# Patient Record
Sex: Male | Born: 1960 | Race: Black or African American | Hispanic: No | Marital: Married | Smoking: Never smoker
Health system: Southern US, Community
[De-identification: ages and names within clinical notes are randomized; demographics above are authoritative.]

## PROBLEM LIST (undated history)

## (undated) DIAGNOSIS — E7800 Pure hypercholesterolemia, unspecified: Secondary | ICD-10-CM

## (undated) DIAGNOSIS — E119 Type 2 diabetes mellitus without complications: Secondary | ICD-10-CM

## (undated) DIAGNOSIS — I10 Essential (primary) hypertension: Secondary | ICD-10-CM

---

## 1999-01-16 ENCOUNTER — Encounter: Payer: Self-pay | Admitting: Emergency Medicine

## 1999-01-16 ENCOUNTER — Emergency Department (HOSPITAL_COMMUNITY): Admission: EM | Admit: 1999-01-16 | Discharge: 1999-01-16 | Payer: Self-pay | Admitting: Emergency Medicine

## 2008-03-18 ENCOUNTER — Encounter: Admission: RE | Admit: 2008-03-18 | Discharge: 2008-03-18 | Payer: Self-pay | Admitting: Family Medicine

## 2008-08-27 ENCOUNTER — Emergency Department (HOSPITAL_BASED_OUTPATIENT_CLINIC_OR_DEPARTMENT_OTHER): Admission: EM | Admit: 2008-08-27 | Discharge: 2008-08-27 | Payer: Self-pay | Admitting: Emergency Medicine

## 2008-08-27 ENCOUNTER — Ambulatory Visit: Payer: Self-pay | Admitting: Radiology

## 2009-01-18 ENCOUNTER — Emergency Department (HOSPITAL_BASED_OUTPATIENT_CLINIC_OR_DEPARTMENT_OTHER): Admission: EM | Admit: 2009-01-18 | Discharge: 2009-01-18 | Payer: Self-pay | Admitting: Emergency Medicine

## 2010-03-14 ENCOUNTER — Encounter: Payer: Self-pay | Admitting: Family Medicine

## 2013-05-11 ENCOUNTER — Emergency Department (HOSPITAL_BASED_OUTPATIENT_CLINIC_OR_DEPARTMENT_OTHER)
Admission: EM | Admit: 2013-05-11 | Discharge: 2013-05-11 | Disposition: A | Discharge status: Home / Self Care | Payer: BC Managed Care – PPO | Attending: Emergency Medicine | Admitting: Emergency Medicine

## 2013-05-11 ENCOUNTER — Encounter (HOSPITAL_BASED_OUTPATIENT_CLINIC_OR_DEPARTMENT_OTHER): Payer: Self-pay | Admitting: Emergency Medicine

## 2013-05-11 ENCOUNTER — Emergency Department (HOSPITAL_BASED_OUTPATIENT_CLINIC_OR_DEPARTMENT_OTHER): Payer: BC Managed Care – PPO

## 2013-05-11 DIAGNOSIS — E7800 Pure hypercholesterolemia, unspecified: Secondary | ICD-10-CM | POA: Insufficient documentation

## 2013-05-11 DIAGNOSIS — J40 Bronchitis, not specified as acute or chronic: Secondary | ICD-10-CM

## 2013-05-11 DIAGNOSIS — Z79899 Other long term (current) drug therapy: Secondary | ICD-10-CM | POA: Insufficient documentation

## 2013-05-11 DIAGNOSIS — IMO0001 Reserved for inherently not codable concepts without codable children: Secondary | ICD-10-CM | POA: Insufficient documentation

## 2013-05-11 DIAGNOSIS — J029 Acute pharyngitis, unspecified: Secondary | ICD-10-CM | POA: Insufficient documentation

## 2013-05-11 DIAGNOSIS — I10 Essential (primary) hypertension: Secondary | ICD-10-CM | POA: Insufficient documentation

## 2013-05-11 DIAGNOSIS — E669 Obesity, unspecified: Secondary | ICD-10-CM | POA: Insufficient documentation

## 2013-05-11 DIAGNOSIS — J209 Acute bronchitis, unspecified: Secondary | ICD-10-CM | POA: Insufficient documentation

## 2013-05-11 HISTORY — DX: Essential (primary) hypertension: I10

## 2013-05-11 HISTORY — DX: Pure hypercholesterolemia, unspecified: E78.00

## 2013-05-11 MED ORDER — AZITHROMYCIN 250 MG PO TABS: 250.0000 mg | ORAL_TABLET | Freq: Every day | ORAL | Status: DC

## 2013-05-11 NOTE — ED Notes (Signed)
Patient states that he has had cold symptoms for appx 3 weeks, now he states that he still has chest congestion that worsens at night when he lays down. He states that he does have a cough that is productive, pt describes as dark yellow phlegm.

## 2013-05-11 NOTE — ED Provider Notes (Signed)
History/physical exam/procedure(s) were performed by non-physician practitioner and as supervising physician I was immediately available for consultation/collaboration. I have reviewed all notes and am in agreement with care and plan.   Hilario Quarryanielle S Nadelyn Enriques, MD 05/11/13 [phone]

## 2013-05-11 NOTE — ED Provider Notes (Signed)
CSN: [id number]     Arrival date & time 05/11/13  1842 History   First MD Initiated Contact with Patient 05/11/13 1924     Chief Complaint  Patient presents with  . Cough     (Consider location/radiation/quality/duration/timing/severity/associated sxs/prior Treatment) Patient is a 53 y.o. male presenting with cough. The history is provided by the patient.  Cough Cough characteristics:  Productive Sputum characteristics:  Yellow Severity:  Moderate Onset quality:  Gradual Duration:  3 weeks Timing:  Intermittent Progression:  Worsening Chronicity:  New Smoker: no   Relieved by:  Nothing Worsened by:  Activity and lying down Ineffective treatments:  Decongestant and cough suppressants Associated symptoms: headaches, myalgias, rhinorrhea, sinus congestion, sore throat and wheezing   Associated symptoms: no chills, no ear fullness, no ear pain, no eye discharge, no fever and no rash    KOREE SCHOPF is 53 y.o. male who presents to the ED with cough and congestion for 3 weeks. He saw his doctor a week or so ago and was told to just take OTC medication for cough and congestion. He has been doing that but not getting any better.   Past Medical History  Diagnosis Date  . Hypertension   . Hypercholesteremia    History reviewed. No pertinent past surgical history. No family history on file. History  Substance Use Topics  . Smoking status: Never Smoker   . Smokeless tobacco: Not on file  . Alcohol Use: No    Review of Systems  Constitutional: Negative for fever and chills.  HENT: Positive for rhinorrhea, sinus pressure and sore throat. Negative for ear pain.   Eyes: Negative for discharge.  Respiratory: Positive for cough and wheezing.   Gastrointestinal: Negative for nausea, vomiting and abdominal pain.  Genitourinary: Negative for dysuria, urgency and frequency.  Musculoskeletal: Positive for myalgias.  Skin: Negative for rash.  Neurological: Positive for headaches.  Negative for syncope.  Psychiatric/Behavioral: Negative for confusion. The patient is not nervous/anxious.       Allergies  Review of patient's allergies indicates no known allergies.  Home Medications   Current Outpatient Rx  Name  Route  Sig  Dispense  Refill  . amLODipine (NORVASC) 10 MG tablet   Oral   Take 10 mg by mouth daily.         . hydrochlorothiazide (HYDRODIURIL) 25 MG tablet   Oral   Take 25 mg by mouth daily.         . simvastatin (ZOCOR) 10 MG tablet   Oral   Take 10 mg by mouth daily.          BP 127/77  Pulse 92  Temp(Src) 98.2 F (36.8 C) (Oral)  Resp 18  Ht 6\' 3"  (1.905 m)  Wt 300 lb (136.079 kg)  BMI 37.50 kg/m2  SpO2 97% Physical Exam  Nursing note and vitals reviewed. Constitutional: He is oriented to person, place, and time.  Obese   HENT:  Head: Normocephalic.  Right Ear: Tympanic membrane normal.  Left Ear: Tympanic membrane normal.  Nose: Right sinus exhibits maxillary sinus tenderness. Left sinus exhibits maxillary sinus tenderness.  Mouth/Throat: Uvula is midline, oropharynx is clear and moist and mucous membranes are normal.  Eyes: Conjunctivae and EOM are normal. Pupils are equal, round, and reactive to light.  Neck: Normal range of motion. Neck supple.  Cardiovascular: Normal rate and regular rhythm.   Pulmonary/Chest: Effort normal. He has no wheezes. He has no rales.  Abdominal: Soft. Bowel sounds are normal. There  is no tenderness.  Musculoskeletal: Normal range of motion.  Neurological: He is alert and oriented to person, place, and time. No cranial nerve deficit.  Skin: Skin is warm and dry.  Psychiatric: He has a normal mood and affect. His behavior is normal.    ED Course  Procedures (including critical care time) Labs Review Labs Reviewed - No data to display Imaging Review Dg Chest 2 View  05/11/2013   CLINICAL DATA:  Cold symptoms  EXAM: CHEST  2 VIEW  COMPARISON:  No comparisons  FINDINGS: Normal  mediastinum and cardiac silhouette. Normal pulmonary vasculature. No evidence of effusion, infiltrate, or pneumothorax. No acute bony abnormality.  IMPRESSION: No acute cardiopulmonary process.   Electronically Signed   By: Genevive BiStewart  Edmunds M.D.   On: 05/11/2013 19:42    MDM  53 y.o. male with cough and congestion x 3 weeks. Will treat with Z-Pak and he will continue his cough and congestion medication. He is stable for discharge without further screening at this time. I have reviewed this patient's vital signs, nurses notes, appropriate labs and imaging.  I have discussed findings and plan of care with the patient and he voices understanding.    Medication List    TAKE these medications       azithromycin 250 MG tablet  Commonly known as:  ZITHROMAX  Take 1 tablet (250 mg total) by mouth daily. Take first 2 tablets together, then 1 every day until finished.      ASK your doctor about these medications       amLODipine 10 MG tablet  Commonly known as:  NORVASC  Take 10 mg by mouth daily.     hydrochlorothiazide 25 MG tablet  Commonly known as:  HYDRODIURIL  Take 25 mg by mouth daily.     simvastatin 10 MG tablet  Commonly known as:  ZOCOR  Take 10 mg by mouth daily.          ChelseaHope M Neese, [place]NP 05/11/13 2041

## 2013-05-11 NOTE — Discharge Instructions (Signed)
Continue your cough and congestion medication and take the antibiotics as directed. Follow up with your doctor or return here as needed.  Bronchitis Bronchitis is swelling (inflammation) of the air tubes leading to your lungs (bronchi). This causes mucus and a cough. If the swelling gets bad, you may have trouble breathing. HOME CARE   Rest.  Drink enough fluids to keep your pee (urine) clear or pale yellow (unless you have a condition where you have to watch how much you drink).  Only take medicine as told by your doctor. If you were given antibiotic medicines, finish them even if you start to feel better.  Avoid smoke, irritating chemicals, and strong smells. These make the problem worse. Quit smoking if you smoke. This helps your lungs heal faster.  Use a cool mist humidifier. Change the water in the humidifier every day. You can also sit in the bathroom with hot shower running for 5 10 minutes. Keep the door closed.  See your health care provider as told.  Wash your hands often. GET HELP IF: Your problems do not get better after 1 week. GET HELP RIGHT AWAY IF:   Your fever gets worse.  You have chills.  Your chest hurts.  Your problems breathing get worse.  You have blood in your mucus.  You pass out (faint).  You feel lightheaded.  You have a bad headache.  You throw up (vomit) again and again. MAKE SURE YOU:  Understand these instructions.  Will watch your condition.  Will get help right away if you are not doing well or get worse. Document Released: 07/27/2007 Document Revised: 11/28/2012 Document Reviewed: 10/02/2012 [facility] LP[facility] Patient Information 2014 [place], [place].

## 2015-06-15 ENCOUNTER — Encounter: Payer: Self-pay | Admitting: Podiatry

## 2015-06-15 ENCOUNTER — Ambulatory Visit (INDEPENDENT_AMBULATORY_CARE_PROVIDER_SITE_OTHER): Payer: BC Managed Care – PPO | Admitting: Podiatry

## 2015-06-15 VITALS — BP 134/84 | HR 84 | Ht 73.0 in | Wt 270.0 lb

## 2015-06-15 DIAGNOSIS — M79673 Pain in unspecified foot: Secondary | ICD-10-CM

## 2015-06-15 DIAGNOSIS — M216X9 Other acquired deformities of unspecified foot: Secondary | ICD-10-CM | POA: Diagnosis not present

## 2015-06-15 DIAGNOSIS — M216X1 Other acquired deformities of right foot: Secondary | ICD-10-CM

## 2015-06-15 DIAGNOSIS — M21969 Unspecified acquired deformity of unspecified lower leg: Secondary | ICD-10-CM

## 2015-06-15 DIAGNOSIS — M722 Plantar fascial fibromatosis: Secondary | ICD-10-CM | POA: Diagnosis not present

## 2015-06-15 DIAGNOSIS — M79606 Pain in leg, unspecified: Secondary | ICD-10-CM

## 2015-06-15 NOTE — Progress Notes (Signed)
SUBJECTIVE: 55 y.o. year old male presents for diabetic foot care. Both feet are hurting and burning at instep area for a month. Blood sugar is under control. 160-120. Diabetic x 1 and a half years.  On feet x 17 hours/ day working in two jobs.   Hx of bone spur removal for left anterior lateral ankle. DM, HTN, Cholesterol.  REVIEW OF SYSTEMS: A comprehensive review of systems was negative except for: Diabetes, Hypertension, and high Cholesterol.   OBJECTIVE: DERMATOLOGIC EXAMINATION: Thick hypertrophic nails x 10. No abnormal skin lesions noted.  VASCULAR EXAMINATION OF LOWER LIMBS: Pedal pulses: All pedal pulses are palpable with normal pulsation. Temperature gradient from tibial crest to dorsum of foot is within normal bilateral.  NEUROLOGIC EXAMINATION OF THE LOWER LIMBS: Achilles DTR is present and within normal. Monofilament (Semmes-Weinstein 10-gm) sensory testing positive 6 out of 6, bilateral. Vibratory sensations(128Hz  turning fork) intact at medial and lateral forefoot bilateral.  Sharp and Dull discriminatory sensations at the plantar ball of hallux is intact bilateral.   MUSCULOSKELETAL EXAMINATION: Positive for short first digit bilateral, excess sagittal plane motion of the first metatarsal L>R, tight Achilles tendon on right.  Forefoot varus when rearfoot is position in neutral bilateral.   RADIOGRAPHIC STUDIES:  General overview: Negative of Osteopenia Absence of soft tissue swelling. No acute changes in osseous or articular surfaces of forefoot or rearfoot.  AP View:  Short first metatarsal L>R and no other gross deformities. Lateral view:  Mild first ray elevation bilateral. Mild anterior break in CYMA line.   ASSESSMENT: 1. Plantar fasciitis bilateral. 2. 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 3. Ankle equinus right. 4. NIDDM.   PLAN: Reviewed clinical findings and available treatment options, NSAIA, exercise, custom orthotics, surgical  options. Reviewed daily stretch exercise for tight Achilles tendon. Return for custom orthotics.

## 2015-06-15 NOTE — Patient Instructions (Signed)
Seen for painful, burning arch both feet. Examination noted of weakened first metatarsal bone on both L>R, and tight Achilles tendon on right. Stretch exercise reviewed. Need Custom orthotics.

## 2015-07-02 ENCOUNTER — Ambulatory Visit: Payer: BC Managed Care – PPO | Admitting: Podiatry

## 2015-07-06 ENCOUNTER — Ambulatory Visit: Payer: BC Managed Care – PPO | Admitting: Podiatry

## 2016-03-08 IMAGING — CR DG CHEST 2V
2 series · 2 of 2 positions shown · non-contrast
Comparison: No comparisons

CLINICAL DATA: Cold symptoms

EXAM:
CHEST  2 VIEW

[w chest pa]
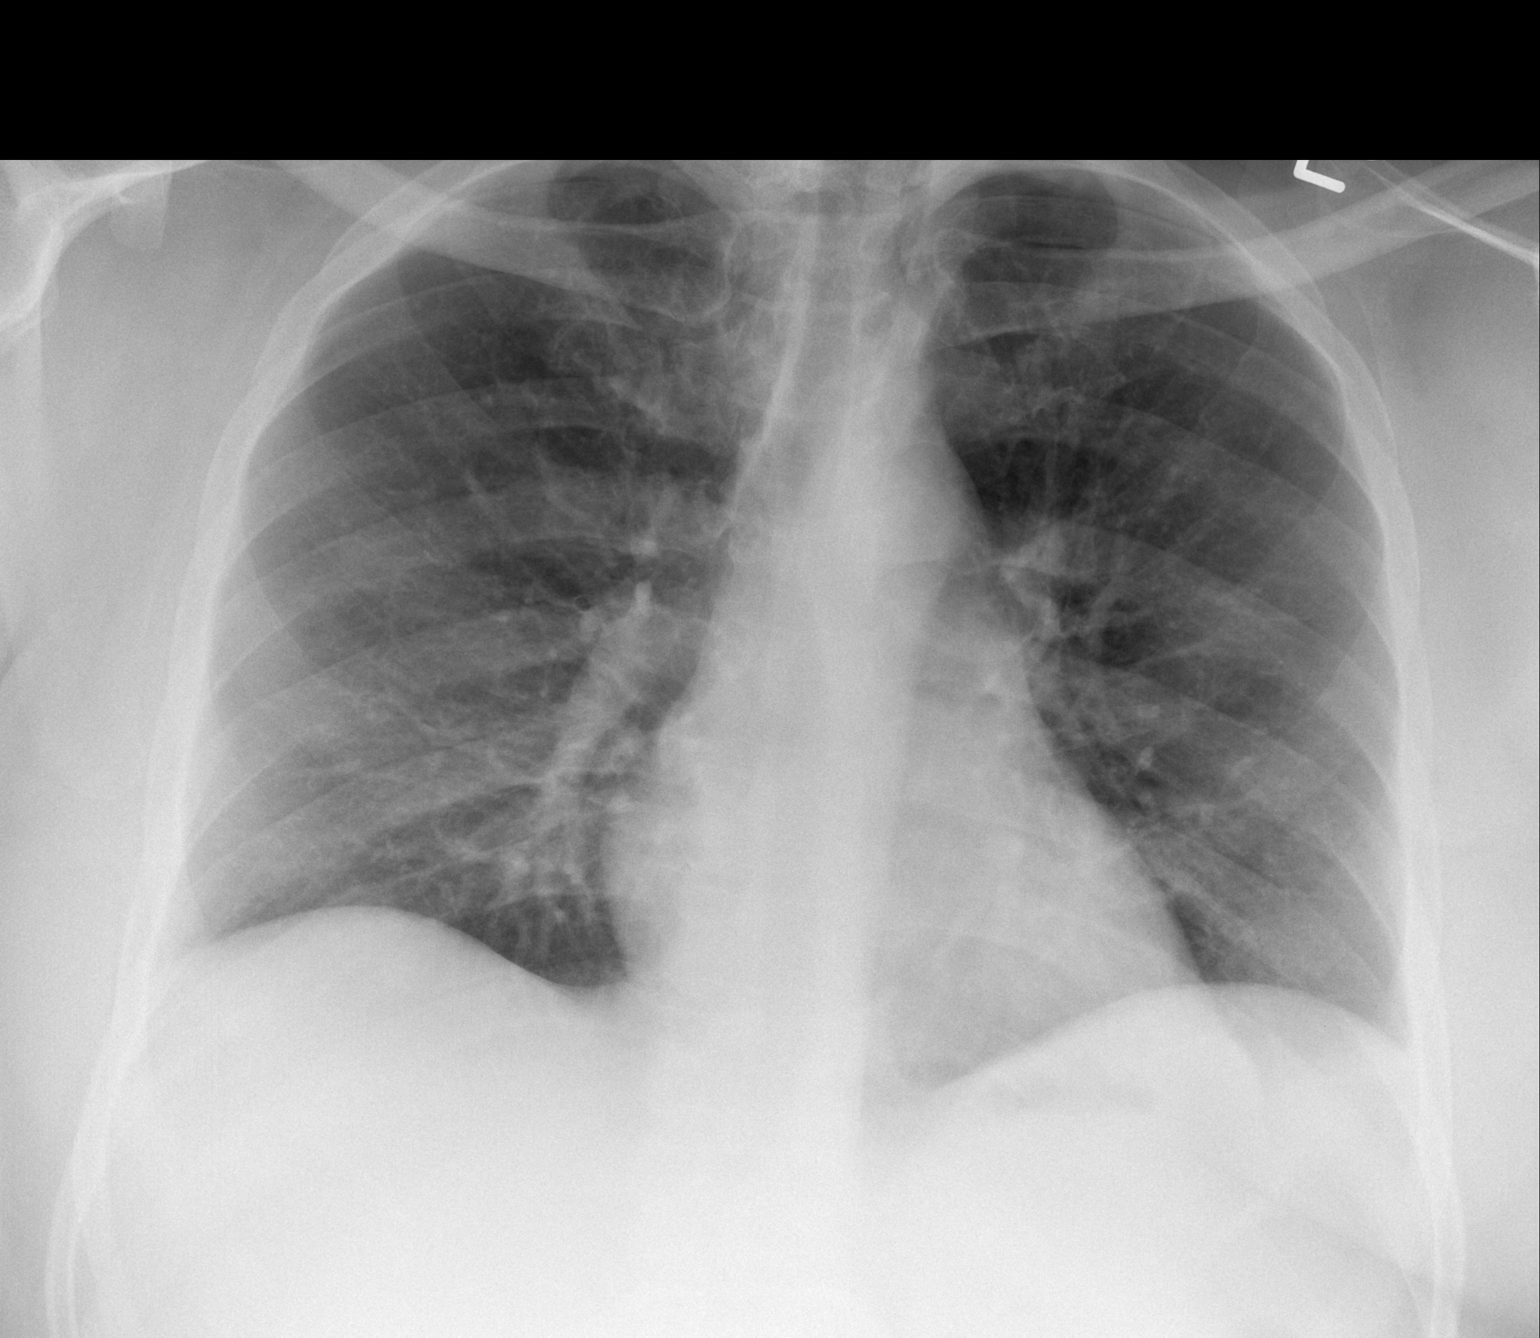

[w chest lat]
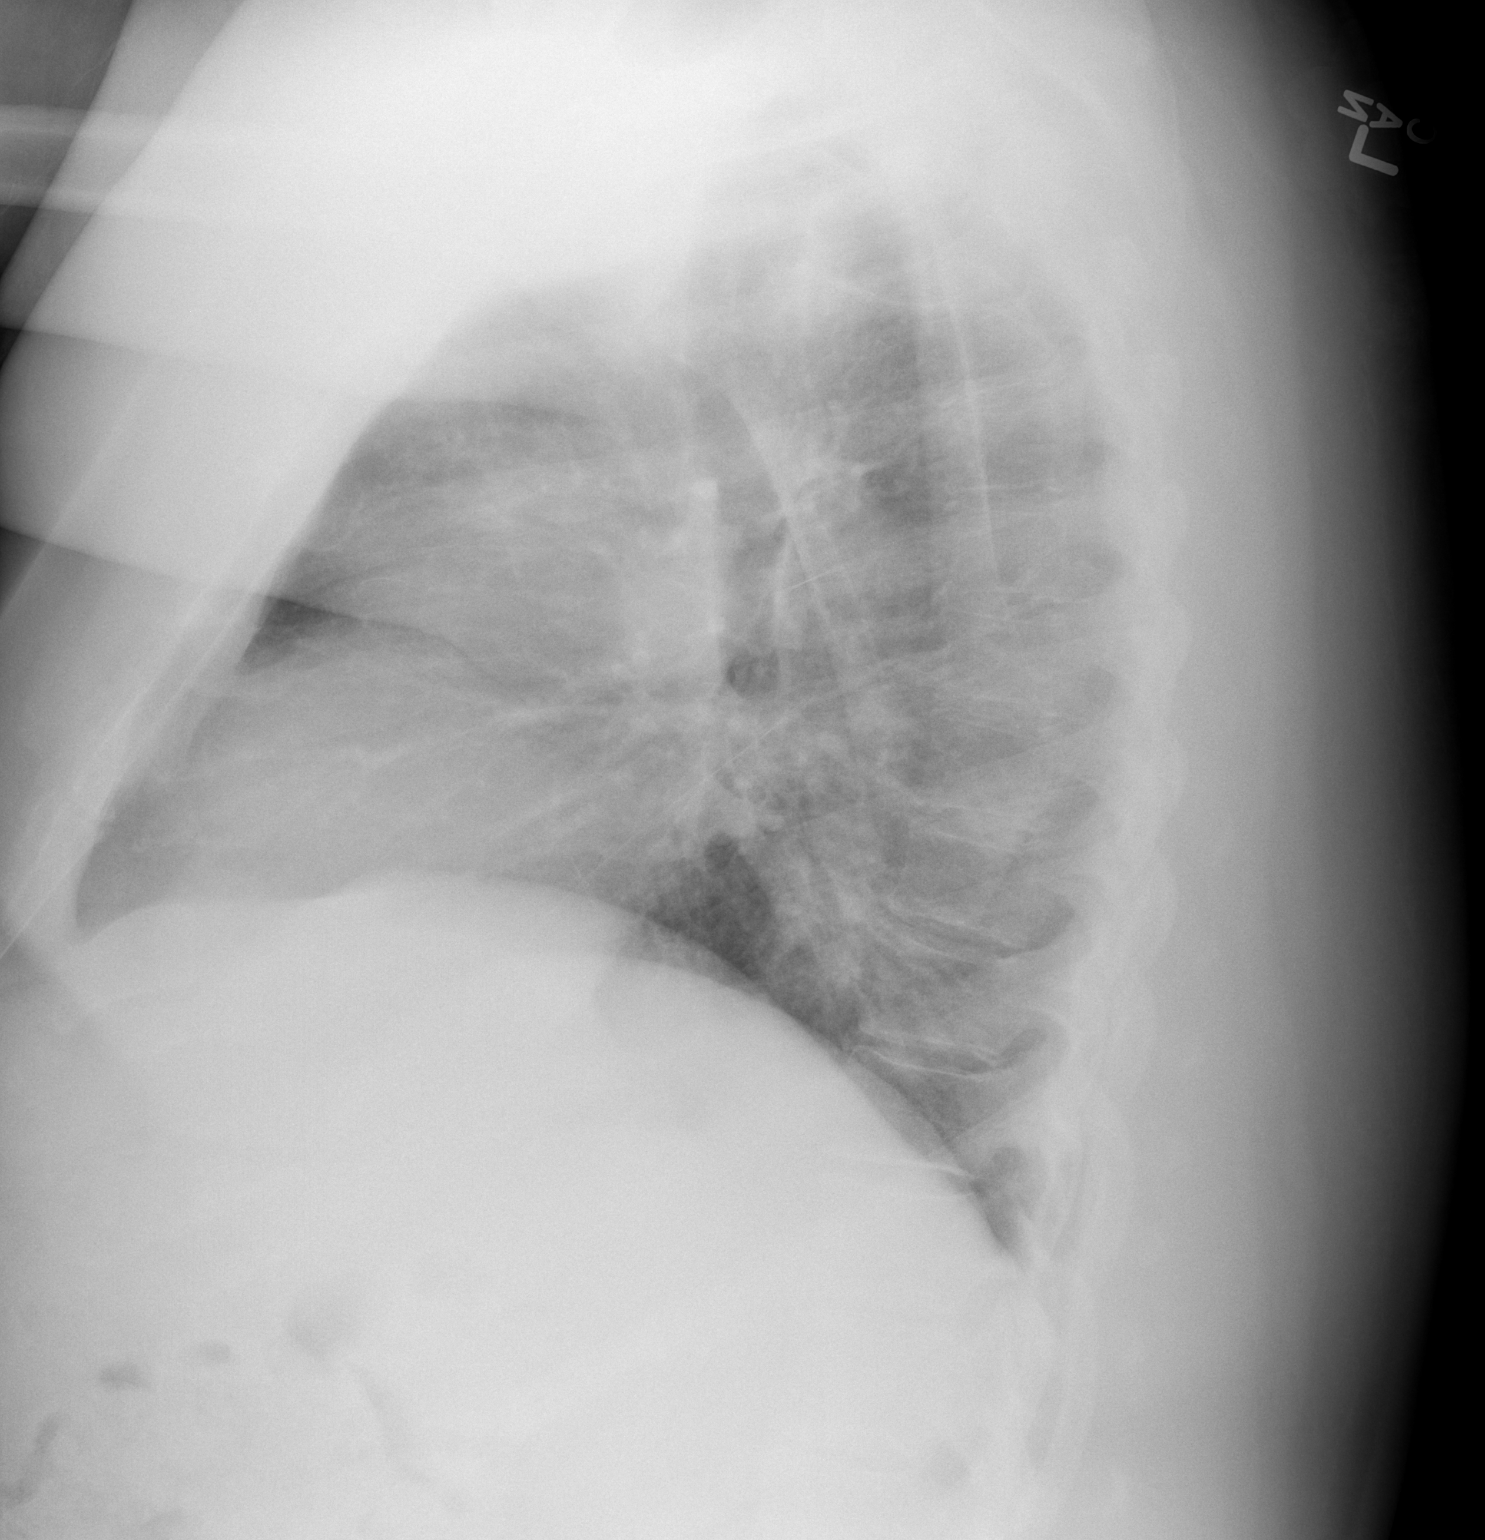

[2 of 2 positions shown; findings below may reference images not displayed]

FINDINGS: Normal mediastinum and cardiac silhouette. Normal pulmonary
vasculature. No evidence of effusion, infiltrate, or pneumothorax.
No acute bony abnormality.
IMPRESSION: No acute cardiopulmonary process.

## 2016-09-17 ENCOUNTER — Encounter (HOSPITAL_BASED_OUTPATIENT_CLINIC_OR_DEPARTMENT_OTHER): Payer: Self-pay | Admitting: Emergency Medicine

## 2016-09-17 ENCOUNTER — Emergency Department (HOSPITAL_BASED_OUTPATIENT_CLINIC_OR_DEPARTMENT_OTHER)
Admission: EM | Admit: 2016-09-17 | Discharge: 2016-09-18 | Disposition: A | Discharge status: Home / Self Care | Payer: BC Managed Care – PPO | Attending: Emergency Medicine | Admitting: Emergency Medicine

## 2016-09-17 ENCOUNTER — Emergency Department (HOSPITAL_BASED_OUTPATIENT_CLINIC_OR_DEPARTMENT_OTHER): Payer: BC Managed Care – PPO

## 2016-09-17 DIAGNOSIS — R079 Chest pain, unspecified: Secondary | ICD-10-CM

## 2016-09-17 DIAGNOSIS — Z7984 Long term (current) use of oral hypoglycemic drugs: Secondary | ICD-10-CM | POA: Diagnosis not present

## 2016-09-17 DIAGNOSIS — I10 Essential (primary) hypertension: Secondary | ICD-10-CM | POA: Diagnosis not present

## 2016-09-17 DIAGNOSIS — E119 Type 2 diabetes mellitus without complications: Secondary | ICD-10-CM | POA: Diagnosis not present

## 2016-09-17 DIAGNOSIS — R0789 Other chest pain: Secondary | ICD-10-CM | POA: Diagnosis not present

## 2016-09-17 DIAGNOSIS — Z79899 Other long term (current) drug therapy: Secondary | ICD-10-CM | POA: Insufficient documentation

## 2016-09-17 DIAGNOSIS — Z7982 Long term (current) use of aspirin: Secondary | ICD-10-CM | POA: Diagnosis not present

## 2016-09-17 HISTORY — DX: Type 2 diabetes mellitus without complications: E11.9

## 2016-09-17 LAB — CBC
HCT: 41 % (ref 39.0–52.0)
Hemoglobin: 14.2 g/dL (ref 13.0–17.0)
MCH: 29.6 pg (ref 26.0–34.0)
MCHC: 34.6 g/dL (ref 30.0–36.0)
MCV: 85.4 fL (ref 78.0–100.0)
Platelets: 188 10*3/uL (ref 150–400)
RBC: 4.8 MIL/uL (ref 4.22–5.81)
RDW: 14.8 % (ref 11.5–15.5)
WBC: 6.9 10*3/uL (ref 4.0–10.5)

## 2016-09-17 LAB — TROPONIN I: Troponin I: <0.03 ng/mL (ref <0.03)

## 2016-09-17 LAB — BASIC METABOLIC PANEL
ANION GAP: 7 (ref 5–15)
BUN: 19 mg/dL (ref 6–20)
CO2: 29 mmol/L (ref 22–32)
Calcium: 8.9 mg/dL (ref 8.9–10.3)
Chloride: 101 mmol/L (ref 101–111)
Creatinine, Ser: 1.02 mg/dL (ref 0.61–1.24)
GFR calc non Af Amer: >60 mL/min (ref >60)
Glucose, Bld: 116 mg/dL — ABNORMAL HIGH (ref 65–99)
Potassium: 3.7 mmol/L (ref 3.5–5.1)
Sodium: 137 mmol/L (ref 135–145)

## 2016-09-17 LAB — D-DIMER, QUANTITATIVE (NOT AT ARMC)

## 2016-09-17 MED ORDER — SODIUM CHLORIDE 0.9 % IV BOLUS (SEPSIS)
1000.0000 mL | Freq: Once | INTRAVENOUS | Status: AC
  Administered 2016-09-17: 1000 mL via INTRAVENOUS

## 2016-09-17 MED ORDER — ACETAMINOPHEN 500 MG PO TABS
1000.0000 mg | ORAL_TABLET | Freq: Once | ORAL | Status: AC
  Administered 2016-09-18: 1000 mg via ORAL
  Filled 2016-09-17: qty 2

## 2016-09-17 MED ORDER — CYCLOBENZAPRINE HCL 10 MG PO TABS: 10.0000 mg | ORAL_TABLET | Freq: Two times a day (BID) | ORAL | 0 refills | Status: AC | PRN

## 2016-09-17 MED ORDER — KETOROLAC TROMETHAMINE 30 MG/ML IJ SOLN
30.0000 mg | Freq: Once | INTRAMUSCULAR | Status: AC
  Administered 2016-09-17: 30 mg via INTRAVENOUS
  Filled 2016-09-17: qty 1

## 2016-09-17 NOTE — Discharge Instructions (Signed)
Please read and follow all provided instructions.  Your diagnoses today include:  1. Chest pain, unspecified type   2. Chest wall pain     Tests performed today include: An EKG of your heart A chest x-ray Cardiac enzymes - a blood test for heart muscle damage Blood counts and electrolytes Vital signs. See below for your results today.   Medications prescribed:   Take any prescribed medications only as directed.  Follow-up instructions: Please follow-up with your primary care provider as soon as you can for further evaluation of your symptoms.   Return instructions:  SEEK IMMEDIATE MEDICAL ATTENTION IF: You have severe chest pain, especially if the pain is crushing or pressure-like and spreads to the arms, back, neck, or jaw, or if you have sweating, nausea (feeling sick to your stomach), or shortness of breath. THIS IS AN EMERGENCY. Don't wait to see if the pain will go away. Get medical help at once. Call 911 or 0 (operator). DO NOT drive yourself to the hospital.  Your chest pain gets worse and does not go away with rest.  You have an attack of chest pain lasting longer than usual, despite rest and treatment with the medications your caregiver has prescribed.  You wake from sleep with chest pain or shortness of breath. You feel dizzy or faint. You have chest pain not typical of your usual pain for which you originally saw your caregiver.  You have any other emergent concerns regarding your health.  Additional Information: Chest pain comes from many different causes. Your caregiver has diagnosed you as having chest pain that is not specific for one problem, but does not require admission.  You are at low risk for an acute heart condition or other serious illness.   Your vital signs today were: BP 118/85    Pulse 76    Resp (!) 21    Ht 6\' 2"  (1.88 m)    Wt 135.6 kg (299 lb)    SpO2 95%    BMI 38.39 kg/m  If your blood pressure (BP) was elevated above 135/85 this visit, please  have this repeated by your doctor within one month. --------------

## 2016-09-17 NOTE — ED Provider Notes (Signed)
[facility] [facility] Provider Note   CSN: [id number] Arrival date & time: 09/17/16  2045     History   Chief Complaint Chief Complaint  Patient presents with  . Chest Pain    HPI LESLEE HAUETER is a 56 y.o. male.  HPI  56 y.o. male with a hx of DM, HTN, HLD, presents to the Emergency Department today due to right sided chest pain x 1 week. Intermittent. Worse with positioning. Worse with breathing. Rates pain 6/10. Mild cough without congestion. Seen by PCP last week and diagnosed with bronchitis. Given Levaquin, which patient has completed. Notes no fevers. No N/V. No diaphoresis. Mild shortness of breath. Notes occasional chest pressure that is substernal, but states he thinks it's just congestion. No numbness/tingling. No hx ACS. No FH ACS. No Hx DVT/PE. No recent surgeries or travel. No meds PTA. No there symptoms noted.   Past Medical History:  Diagnosis Date  . Diabetes mellitus without complication ([place])   . Hypercholesteremia   . Hypertension     Patient Active Problem List   Diagnosis Date Noted  . Pain in lower limb 06/15/2015    History reviewed. No pertinent surgical history.     Home Medications    Prior to Admission medications   Medication Sig Start Date End Date Taking? Authorizing Provider  amLODipine (NORVASC) 10 MG tablet Take 10 mg by mouth daily.    [provider]  aspirin EC 81 MG tablet  06/18/12   [provider]  atorvastatin (LIPITOR) 20 MG tablet  05/05/15   [provider]  Blood Glucose Monitoring Suppl ([place]) w/Device KIT See admin instructions. 03/10/15   [provider]  fluticasone Asencion Islam) 50 MCG/ACT nasal spray  11/24/13   [provider]  gabapentin (NEURONTIN) 100 MG capsule  06/09/15   [provider]  hydrochlorothiazide (HYDRODIURIL) 25 MG tablet Take 25 mg by mouth daily.    [provider]  JANUVIA 100 MG tablet  05/01/15   [provider]  losartan (COZAAR) 50 MG tablet  05/05/15   [provider]  metFORMIN (GLUCOPHAGE-XR) 500 MG 24 hr tablet  06/07/15   [provider]  traZODone (DESYREL) 100 MG tablet  05/22/15   [provider]    Family History No family history on file.  Social History Social History  Substance Use Topics  . Smoking status: Never Smoker  . Smokeless tobacco: Never Used  . Alcohol use No     Allergies   Lisinopril and Hydrocodone-homatropine   Review of Systems Review of Systems ROS reviewed and all are negative for acute change except as noted in the HPI.  Physical Exam Updated Vital Signs BP (!) 142/96   Pulse 80   Resp (!) 31   Ht _0  (1.88 m)   Wt 135.6 kg (299 lb)   SpO2 96%   BMI 38.39 kg/m   Physical Exam  Constitutional: He is oriented to person, place, and time. Vital signs are normal. He appears well-developed and well-nourished. No distress.  NAD. Resting comfortably   HENT:  Head: Normocephalic and atraumatic.  Right Ear: Hearing, tympanic membrane, external ear and ear canal normal.  Left Ear: Hearing, tympanic membrane, external ear and ear canal normal.  Nose: Nose normal.  Mouth/Throat: Uvula is midline, oropharynx is clear and moist and mucous membranes are normal. No trismus in the jaw. No oropharyngeal exudate, posterior oropharyngeal erythema or tonsillar abscesses.  Eyes: Pupils are equal,  round, and reactive to light. Conjunctivae and EOM are normal.  Neck: Normal range of motion. Neck supple. No tracheal deviation present.  Cardiovascular: Normal rate, regular rhythm, S1 normal, S2 normal, normal heart sounds, intact distal pulses and normal pulses.   Pulmonary/Chest: Effort normal and breath sounds normal. No respiratory distress. He has no decreased breath sounds. He has no wheezes. He has no rhonchi. He has no rales.  TTP right lateral chest wall. No palpable or visible deformities.   Abdominal: Soft. Normal  appearance and bowel sounds are normal. There is no tenderness.  Musculoskeletal: Normal range of motion.  Neurological: He is alert and oriented to person, place, and time.  Skin: Skin is warm and dry.  Psychiatric: He has a normal mood and affect. His speech is normal and behavior is normal. Thought content normal.  Nursing note and vitals reviewed.  ED Treatments / Results  Labs (all labs ordered are listed, but only abnormal results are displayed) Labs Reviewed  BASIC METABOLIC PANEL - Abnormal; Notable for the following:       Result Value   Glucose, Bld 116 (*)    All other components within normal limits  CBC  TROPONIN I  D-DIMER, QUANTITATIVE (NOT AT [facility])    EKG  EKG Interpretation  Date/Time:  Saturday September 17 2016 22:15:08 EDT Ventricular Rate:  79 PR Interval:    QRS Duration: 85 QT Interval:  372 QTC Calculation: 427 R Axis:   80 Text Interpretation:  Sinus rhythm No old tracing to compare Confirmed by [place], Inocente Salles [phone]) on 09/17/2016 10:24:29 PM       Radiology Dg Chest 2 View  Result Date: 09/17/2016 CLINICAL DATA:  56 y/o M; right-sided chest pain since this morning. EXAM: CHEST  2 VIEW COMPARISON:  09/07/2016 chest radiograph FINDINGS: Low lung volumes accentuate pulmonary markings. Streaky opacities in lung bases probably represent minor atelectasis. Stable cardiac silhouette given projection and technique. No focal consolidation. No pleural effusion or pneumothorax. Bones are unremarkable. IMPRESSION: Low lung volumes and minor bibasilar atelectasis. Electronically Signed   By: Kristine Garbe M.D.   On: 09/17/2016 22:16    Procedures Procedures (including critical care time)  Medications Ordered in ED Medications  sodium chloride 0.9 % bolus 1,000 mL (1,000 mLs Intravenous New Bag/Given 09/17/16 2224)  ketorolac (TORADOL) 30 MG/ML injection 30 mg (30 mg Intravenous Given 09/17/16 2224)   Initial Impression / Assessment and Plan / ED  Course  I have reviewed the triage vital signs and the nursing notes.  Pertinent labs & imaging results that were available during my care of the patient were reviewed by me and considered in my medical decision making (see chart for details).  Final Clinical Impressions(s) / ED Diagnoses  {I have reviewed and evaluated the relevant laboratory values. {I have reviewed and evaluated the relevant imaging studies. {I have interpreted the relevant EKG. {I have reviewed the relevant previous healthcare records.  {I obtained HPI from historian. {Patient discussed with supervising physician.  ED Course:  Assessment: Pt is a 56 y.o. male with a hx of DM, HTN, HLD, presents to the Emergency Department today due to right sided chest pain x 1 week. Intermittent. Worse with positioning. Worse with breathing. Rates pain 6/10. Mild cough without congestion. Seen by PCP last week and diagnosed with bronchitis. Given Levaquin, which patient has completed. Notes no fevers. No N/V. No diaphoresis. Mild shortness of breath. Notes occasional chest pressure that is substernal, but states he thinks it's just  congestion. No numbness/tingling. No hx ACS. No FH ACS. No Hx DVT/PE. No recent surgeries or travel. No meds PTA. Given toradol in ED with improvement. Patient is to be discharged with recommendation to follow up with PCP in regards to today's hospital visit. Chest pain is not likely of cardiac or pulmonary etiology d/t presentation, perc negative, VSS, no tracheal deviation, no JVD or new murmur, RRR, breath sounds equal bilaterally, EKG without acute abnormalities, negative troponin, and negative CXR. Low probability for PE/DVT, but unable to [facility] patient. D Dimer negative. Pt is TTP along chest wall and reports only pain with movement. Endorses lifting for work. Pt has been advised to return to the ED is CP becomes exertional, associated with diaphoresis or nausea, radiates to left jaw/arm, worsens or becomes concerning  in any way. Pt appears reliable for follow up and is agreeable to discharge. Patient is in no acute distress. Vital Signs are stable. Patient is able to ambulate. Patient able to tolerate PO.   Disposition/Plan:  DC Home Additional Verbal discharge instructions given and discussed with patient.  Pt Instructed to f/u with PCP in the next week for evaluation and treatment of symptoms. Return precautions given Pt acknowledges and agrees with plan  Supervising Physician Orlie Dakin, MD  Final diagnoses:  Chest pain, unspecified type  Chest wall pain    New Prescriptions New Prescriptions   No medications on file     Conni Slipper 09/17/16 2343    Orlie Dakin, MD 09/18/16 [phone]

## 2016-09-17 NOTE — ED Notes (Signed)
Alert, NAD, calm, interactive, resps e/u, speaking in clear complete sentences, no dyspnea noted, skin W&D, VSS, c/o R lateral/lower rib/lung pain, increased with deep inspiration, recent "fluid on lung", finished antibiotics, (denies: sob, nausea, dizziness or visual changes).

## 2016-09-17 NOTE — ED Triage Notes (Signed)
Pt c/o RT rib pain w/ movement; sts dx with fluid on lungs last week

## 2016-09-17 NOTE — ED Provider Notes (Signed)
Plains of right-sided lateral chest pain and mid axillary line onset this morning. He only has pain when he changes positions. He denies any shortness of breath cough or fever. On exam he is in no distress lungs clear auscultation heart regular rate and rhythm no murmurs or rubs chest is exquisitely tender at right side mid axillary line, pain is easily reproducible. Chest x-ray viewed by me. Exam and symptoms consistent with chest wall pain. Plan Tylenol for pain. Low pretest probability for pulmonary embolism. Negative d-dimer   Doug SouJacubowitz, [employer] Schwanke, MD 09/17/16 2342

## 2016-10-13 ENCOUNTER — Ambulatory Visit (INDEPENDENT_AMBULATORY_CARE_PROVIDER_SITE_OTHER): Payer: BC Managed Care – PPO | Admitting: Podiatry

## 2016-10-13 ENCOUNTER — Encounter: Payer: Self-pay | Admitting: Podiatry

## 2016-10-13 DIAGNOSIS — M722 Plantar fascial fibromatosis: Secondary | ICD-10-CM | POA: Diagnosis not present

## 2016-10-13 DIAGNOSIS — M21969 Unspecified acquired deformity of unspecified lower leg: Secondary | ICD-10-CM

## 2016-10-13 DIAGNOSIS — M79606 Pain in leg, unspecified: Secondary | ICD-10-CM

## 2016-10-13 DIAGNOSIS — B351 Tinea unguium: Secondary | ICD-10-CM

## 2016-10-13 DIAGNOSIS — M6701 Short Achilles tendon (acquired), right ankle: Secondary | ICD-10-CM

## 2016-10-13 NOTE — Progress Notes (Signed)
SUBJECTIVE: 56 y.o. year old diabetic male presents requesting custom orthotics. Left heel pain worse in the morning and in the mid afternoon.   HPI: Seen on 06/15/15 for pain and burning sensation at instep area duration of a month. Blood sugar is under control. 160-120. Diabetic x 1 and a half years.   On feet x 17 hours/ day working in two jobs.  Hx of bone spur removal for left anterior lateral ankle. DM, HTN, Cholesterol.  OBJECTIVE: DERMATOLOGIC EXAMINATION: Thick hypertrophic nails x 10. No abnormal skin lesions noted.  VASCULAR EXAMINATION OF LOWER LIMBS: Pedal pulses: All pedal pulses are palpable with normal pulsation. Temperature gradient from tibial crest to dorsum of foot is within normal bilateral.  NEUROLOGIC EXAMINATION OF THE LOWER LIMBS: Achilles DTR is present and within normal. Monofilament (Semmes-Weinstein 10-gm) sensory testing positive 6 out of 6, bilateral. Vibratory sensations(128Hz  turning fork) intact at medial and lateral forefoot bilateral.  Sharp and Dull discriminatory sensations at the plantar ball of hallux is intact bilateral.   MUSCULOSKELETAL EXAMINATION: Positive for short first digit bilateral, excess sagittal plane motion of the first metatarsal L>R, tight Achilles tendon on right.  Forefoot varus when rearfoot is position in neutral bilateral.  Pain left heel and arch with prolonged weight bearing and in the morning on left.  Last x-ray report includes,  AP View:  Short first metatarsal L>R and no other gross deformities. Lateral view:  Mild first ray elevation bilateral. Mild anterior break in CYMA line.   ASSESSMENT: 1. Plantar fasciitis bilateral. 2. 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 3. Ankle equinus right. 4. NIDDM.   PLAN: Reviewed clinical findings and available treatment options, NSAIA, exercise, custom orthotics, surgical options. Continue with daily stretch exercise for tight Achilles tendon. Both feet  casted for custom orthotics. All nails debrided. Night splint dispensed for left heel pain.

## 2016-10-13 NOTE — Patient Instructions (Signed)
Both feet casted for Orthotics. All nails debrided. Night splint dispensed with instruction. Continue with stretch exercise.  Will call when orthotics are ready.

## 2016-12-27 ENCOUNTER — Ambulatory Visit: Payer: BC Managed Care – PPO | Admitting: Podiatry

## 2016-12-27 ENCOUNTER — Encounter: Payer: Self-pay | Admitting: Podiatry

## 2016-12-27 DIAGNOSIS — M21969 Unspecified acquired deformity of unspecified lower leg: Secondary | ICD-10-CM

## 2016-12-27 DIAGNOSIS — B351 Tinea unguium: Secondary | ICD-10-CM

## 2016-12-27 DIAGNOSIS — M722 Plantar fascial fibromatosis: Secondary | ICD-10-CM

## 2016-12-27 NOTE — Progress Notes (Signed)
Subjective: 56 y.o. year old male patient presents for one month orthotic check and painful nails. Patient requests toe nails trimmed.  Doing well with orthotics. Stated that "I love it". He was treated with custom orthotics for left heel pain. Stated that his blood sugar runs at about 86 in the morning and goes to 120 in the afternoon. He is retired and does daily walking exercise, several times 45 minutes each time.  Been diabetic for about 2 years. Also under medical management for hypertension and high cholesterol. History of bone spur removal on left anterior lateral ankle.  Objective: Dermatologic: Thick yellow deformed nails x 10. Vascular: Pedal pulses are all palpable. Orthopedic:Short first metatarsal L>R, with tight Achilles tendon right. History of left heel pain. Neurologic: All epicritic and tactile sensations grossly intact.  Assessment: Dystrophic mycotic nails x 10. Improved left heel pain with orthotic treatment. 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 NIDDM under control.  Treatment: All mycotic nails debrided.  Return in 3 months or as needed.

## 2016-12-27 NOTE — Patient Instructions (Signed)
Seen for one month orthotic check and hypertrophic nails. Doing well with orthotics. All nails debrided. Return in 3 months or as needed.

## 2017-05-04 ENCOUNTER — Ambulatory Visit: Payer: BC Managed Care – PPO | Admitting: Podiatry

## 2017-05-08 ENCOUNTER — Emergency Department (HOSPITAL_BASED_OUTPATIENT_CLINIC_OR_DEPARTMENT_OTHER)
Admission: EM | Admit: 2017-05-08 | Discharge: 2017-05-08 | Disposition: A | Discharge status: Home / Self Care | Payer: BC Managed Care – PPO | Attending: Emergency Medicine | Admitting: Emergency Medicine

## 2017-05-08 ENCOUNTER — Other Ambulatory Visit: Payer: Self-pay

## 2017-05-08 ENCOUNTER — Encounter (HOSPITAL_BASED_OUTPATIENT_CLINIC_OR_DEPARTMENT_OTHER): Payer: Self-pay | Admitting: Emergency Medicine

## 2017-05-08 DIAGNOSIS — E119 Type 2 diabetes mellitus without complications: Secondary | ICD-10-CM | POA: Insufficient documentation

## 2017-05-08 DIAGNOSIS — Z7984 Long term (current) use of oral hypoglycemic drugs: Secondary | ICD-10-CM | POA: Insufficient documentation

## 2017-05-08 DIAGNOSIS — R1031 Right lower quadrant pain: Secondary | ICD-10-CM | POA: Diagnosis present

## 2017-05-08 DIAGNOSIS — Z7982 Long term (current) use of aspirin: Secondary | ICD-10-CM | POA: Insufficient documentation

## 2017-05-08 DIAGNOSIS — I10 Essential (primary) hypertension: Secondary | ICD-10-CM | POA: Insufficient documentation

## 2017-05-08 LAB — CBC
HCT: 42.4 % (ref 39.0–52.0)
Hemoglobin: 14.3 g/dL (ref 13.0–17.0)
MCH: 29.1 pg (ref 26.0–34.0)
MCHC: 33.7 g/dL (ref 30.0–36.0)
MCV: 86.2 fL (ref 78.0–100.0)
Platelets: 201 10*3/uL (ref 150–400)
RBC: 4.92 MIL/uL (ref 4.22–5.81)
RDW: 15.2 % (ref 11.5–15.5)
WBC: 5.9 10*3/uL (ref 4.0–10.5)

## 2017-05-08 LAB — URINALYSIS, ROUTINE W REFLEX MICROSCOPIC
Bilirubin Urine: NEGATIVE
KETONES UR: NEGATIVE mg/dL
Leukocytes, UA: NEGATIVE
NITRITE: NEGATIVE
PROTEIN: NEGATIVE mg/dL
Specific Gravity, Urine: 1.01 (ref 1.005–1.030)
pH: 6 (ref 5.0–8.0)

## 2017-05-08 LAB — URINALYSIS, MICROSCOPIC (REFLEX): WBC UA: NONE SEEN WBC/hpf (ref 0–5)

## 2017-05-08 MED ORDER — ACETAMINOPHEN 500 MG PO TABS
1000.0000 mg | ORAL_TABLET | Freq: Once | ORAL | Status: AC
  Administered 2017-05-08: 1000 mg via ORAL
  Filled 2017-05-08: qty 2

## 2017-05-08 NOTE — ED Provider Notes (Signed)
Complains of right lower quadrant pain nonradiating onset yesterday, progressively worsening.  No anorexia.  Ate chicken and Jamaica[place] fries today.  Last bowel movement yesterday.  No urinary symptoms.  [facility] today.  Appendicitis ruled out.  Also had blood work in urinalysis, essentially unremarkable prescribed tramadol which she is taken without relief.  On exam he is alert and in no distress lungs clear to auscultation heart regular rate and rhythm abdomen obese, normal active bowel sounds nontender.  Genitalia normal male.  Bilateral femoral pulses 2+.  Bilateral DP pulses 2+.  Suggest Tylenol for pain.  Follow-up with PMD if not better  later this week   Doug SouJacubowitz, Raine Blodgett, MD 05/09/17 0005

## 2017-05-08 NOTE — ED Notes (Signed)
NAD at this time. Pt is stable and going home.  

## 2017-05-08 NOTE — ED Notes (Signed)
ED Provider at bedside. 

## 2017-05-08 NOTE — ED Triage Notes (Signed)
[facility].  Reports ruled out appendicitis and given RX for tramadol.  Reports tramadol has made pain worse.  Continues to c/o RLQ abdominal pain.  Denies N/V/D.

## 2017-05-08 NOTE — ED Provider Notes (Signed)
[place] EMERGENCY DEPARTMENT Provider Note   CSN: [id number] Arrival date & time: 05/08/17  1547     History   Chief Complaint Chief Complaint  Patient presents with  . Abdominal Pain    HPI Elijah Medina is a 57 y.o. male.  HPI  Elijah Medina is a 57yo male with a history of non-insulin-dependent type 2 diabetes, high cholesterol, hypertension who presents to the emergency department from [facility] emergency department for evaluation of ongoing right lower quadrant pain.  Patient reports that his symptoms started yesterday afternoon around 3 PM and have gradually worsened into today.  He states that his pain is 3/10 in severity when he is lying still, but becomes a 6/10 in severity with palpation or when he flexes his right hip.  Pain feels "dull and aching" in nature.  It does not radiate.  He denies recent injury or heavy lifting. He denies fevers, chills, nausea/vomiting, diarrhea, hematochezia, melena, dysuria, urinary frequency, hematuria, flank pain, testicular pain/swelling, chest pain, shortness of breath, light headedness. Last bowel movement was this morning and normal. He denies previous abdominal surgeries.   Per chart review patient was seen at [facility] emergency department for this same complaint earlier today.  CT abdomen/pelvis negative for acute abnormality.  No appendicitis, hernia, AAA.  Blood work was reassuring, no leukocytosis, lipase negative, UA without evidence of infection.  He was discharged with tramadol and told to follow-up with his primary doctor in 2 days.  Past Medical History:  Diagnosis Date  . Diabetes mellitus without complication ([place])   . Hypercholesteremia   . Hypertension     Patient Active Problem List   Diagnosis Date Noted  . Pain in lower limb 06/15/2015    History reviewed. No pertinent surgical history.     Home Medications    Prior to Admission medications   Medication Sig Start Date End  Date Taking? Authorizing Provider  amLODipine (NORVASC) 10 MG tablet Take 10 mg by mouth daily.    [provider]  aspirin EC 81 MG tablet  06/18/12   [provider]  atorvastatin (LIPITOR) 20 MG tablet  05/05/15   [provider]  Blood Glucose Monitoring Suppl ([place]) w/Device KIT See admin instructions. 03/10/15   [provider]  cyclobenzaprine (FLEXERIL) 10 MG tablet Take 1 tablet (10 mg total) by mouth 2 (two) times daily as needed for muscle spasms. 09/17/16   Shary Decamp, PA-C  fluticasone Asencion Islam) 50 MCG/ACT nasal spray  11/24/13   [provider]  gabapentin (NEURONTIN) 100 MG capsule  06/09/15   [provider]  hydrochlorothiazide (HYDRODIURIL) 25 MG tablet Take 25 mg by mouth daily.    [provider]  JANUVIA 100 MG tablet  05/01/15   [provider]  losartan (COZAAR) 50 MG tablet  05/05/15   [provider]  metFORMIN (GLUCOPHAGE-XR) 500 MG 24 hr tablet  06/07/15   [provider]  traZODone (DESYREL) 100 MG tablet  05/22/15   [provider]    Family History History reviewed. No pertinent family history.  Social History Social History   Tobacco Use  . Smoking status: Never Smoker  . Smokeless tobacco: Never Used  Substance Use Topics  . Alcohol use: No    Alcohol/week: 0.0 oz  . Drug use: No     Allergies   Lisinopril and Hydrocodone-homatropine   Review of Systems Review of Systems  Constitutional: Negative for chills and  fever.  Eyes: Negative for visual disturbance.  Respiratory: Negative for shortness of breath.   Cardiovascular: Negative for chest pain.  Gastrointestinal: Positive for abdominal pain (RLQ). Negative for diarrhea, nausea and vomiting.  Genitourinary: Negative for difficulty urinating, discharge, dysuria, flank pain, frequency, hematuria, scrotal swelling and testicular pain.  Musculoskeletal: Negative for gait problem.    Skin: Negative for rash.  Neurological: Negative for weakness and numbness.  Psychiatric/Behavioral: Negative for agitation.     Physical Exam Updated Vital Signs BP 140/84   Pulse 93   Temp 98.2 F (36.8 C) (Oral)   Resp 20   Ht _0  (1.88 m)   Wt 126.1 kg (278 lb)   SpO2 98%   BMI 35.69 kg/m   Physical Exam  Constitutional: He is oriented to person, place, and time. He appears well-developed and well-nourished. No distress.  HENT:  Head: Normocephalic and atraumatic.  Mouth/Throat: Oropharynx is clear and moist. No oropharyngeal exudate.  Eyes: Conjunctivae are normal. Pupils are equal, round, and reactive to light. Right eye exhibits no discharge. Left eye exhibits no discharge.  Neck: Normal range of motion. Neck supple.  Cardiovascular: Normal rate, regular rhythm and intact distal pulses. Exam reveals no friction rub.  No murmur heard. Pulmonary/Chest: Effort normal. No respiratory distress.  Abdominal:  Abdomen soft and non-distended. Tenderness to palpation over RLQ and over right groin area. No guarding or rigidity. No rebound tenderness. No overlying rash. No CVA tenderness.  Musculoskeletal:  No tenderness over the T-spine or L-spine. No tenderness over the paraspinal muscles of the lumbar spine.   Lymphadenopathy:    He has no cervical adenopathy.  Neurological: He is alert and oriented to person, place, and time. Coordination normal.  Skin: Skin is warm and dry. Capillary refill takes less than 2 seconds. He is not diaphoretic.  Psychiatric: He has a normal mood and affect. His behavior is normal.  Nursing note and vitals reviewed.    ED Treatments / Results  Labs (all labs ordered are listed, but only abnormal results are displayed) Labs Reviewed  URINALYSIS, ROUTINE W REFLEX MICROSCOPIC - Abnormal; Notable for the following components:      Result Value   Glucose, UA >=500 (*)    Hgb urine dipstick TRACE (*)    All other components within normal  limits  URINALYSIS, MICROSCOPIC (REFLEX) - Abnormal; Notable for the following components:   Bacteria, UA RARE (*)    Squamous Epithelial / LPF 0-5 (*)    All other components within normal limits  CBC    EKG  EKG Interpretation None       Radiology No results found.  Procedures Procedures (including critical care time)  Medications Ordered in ED Medications  acetaminophen (TYLENOL) tablet 1,000 mg (1,000 mg Oral Given 05/08/17 1654)     Initial Impression / Assessment and Plan / ED Course  I have reviewed the triage vital signs and the nursing notes.  Pertinent labs & imaging results that were available during my care of the patient were reviewed by me and considered in my medical decision making (see chart for details).    Presents from [facility] Emergency Department where patient had a negative CT abdomen/pelvis and reassuring lab work for evaluation of RLQ pain. On exam, patient is afebrile and VSS. Mildly tender to palpation in the RLQ. Patient denies change or worsening of symptoms from when he was seen at [facility] earlier today. Is asking for further pain management.    Reviewed CT  abdomen/pelvis report from earlier today and blood work including CBC, CMP, UA and Lipase. Results largely reassuring. Patient did have an elevated blood glucose and glycosuria which he was informed to follow up with his primary doctor for in 2 days.   I see no further need for imaging at this time. Discussed this patient with Dr. Winfred Leeds who also saw the patient and agrees. Suggest tylenol for his pain and patient agrees. His blood pressure was elevated in the emergency department today, counseled him to also have this rechecked and managed by his primary care doctor. Discussed return precautions with patient who agrees and voices understanding to the above plan.    Final Clinical Impressions(s) / ED Diagnoses   Final diagnoses:  Right lower quadrant abdominal pain    ED Discharge  Orders    None       Bernarda Caffey 05/09/17 1701    Orlie Dakin, MD 05/10/17 [phone]

## 2017-05-08 NOTE — Discharge Instructions (Signed)
Your blood work and imaging from earlier today were reassuring.  Please take Tylenol as needed for your pain.  Follow-up with your regular doctor in 2 days for recheck.  Your blood pressure was elevated in the ER today, please have this rechecked by her regular doctor.  Return to the emergency department if you develop fever greater than 100.4 F, vomiting that does not stop or have any new or concerning symptoms.

## 2020-02-02 ENCOUNTER — Other Ambulatory Visit: Payer: Self-pay

## 2020-02-02 ENCOUNTER — Emergency Department (HOSPITAL_BASED_OUTPATIENT_CLINIC_OR_DEPARTMENT_OTHER)
Admission: EM | Admit: 2020-02-02 | Discharge: 2020-02-02 | Disposition: A | Discharge status: Home / Self Care | Payer: BC Managed Care – PPO | Attending: Emergency Medicine | Admitting: Emergency Medicine

## 2020-02-02 ENCOUNTER — Encounter (HOSPITAL_BASED_OUTPATIENT_CLINIC_OR_DEPARTMENT_OTHER): Payer: Self-pay | Admitting: Emergency Medicine

## 2020-02-02 ENCOUNTER — Emergency Department (HOSPITAL_BASED_OUTPATIENT_CLINIC_OR_DEPARTMENT_OTHER): Payer: BC Managed Care – PPO

## 2020-02-02 DIAGNOSIS — R059 Cough, unspecified: Secondary | ICD-10-CM

## 2020-02-02 DIAGNOSIS — Z7982 Long term (current) use of aspirin: Secondary | ICD-10-CM | POA: Diagnosis not present

## 2020-02-02 DIAGNOSIS — Z79899 Other long term (current) drug therapy: Secondary | ICD-10-CM | POA: Insufficient documentation

## 2020-02-02 DIAGNOSIS — E119 Type 2 diabetes mellitus without complications: Secondary | ICD-10-CM | POA: Insufficient documentation

## 2020-02-02 DIAGNOSIS — Z7984 Long term (current) use of oral hypoglycemic drugs: Secondary | ICD-10-CM | POA: Diagnosis not present

## 2020-02-02 DIAGNOSIS — J09X2 Influenza due to identified novel influenza A virus with other respiratory manifestations: Secondary | ICD-10-CM | POA: Diagnosis not present

## 2020-02-02 DIAGNOSIS — Z20822 Contact with and (suspected) exposure to covid-19: Secondary | ICD-10-CM | POA: Diagnosis not present

## 2020-02-02 DIAGNOSIS — I10 Essential (primary) hypertension: Secondary | ICD-10-CM | POA: Insufficient documentation

## 2020-02-02 DIAGNOSIS — J101 Influenza due to other identified influenza virus with other respiratory manifestations: Secondary | ICD-10-CM

## 2020-02-02 LAB — RESP PANEL BY RT-PCR (FLU A&B, COVID) ARPGX2
Influenza A by PCR: POSITIVE — AB
Influenza B by PCR: NEGATIVE
SARS Coronavirus 2 by RT PCR: NEGATIVE

## 2020-02-02 MED ORDER — ALBUTEROL SULFATE HFA 108 (90 BASE) MCG/ACT IN AERS
2.0000 | INHALATION_SPRAY | Freq: Once | RESPIRATORY_TRACT | Status: AC
  Administered 2020-02-02: 12:00:00 2 via RESPIRATORY_TRACT
  Filled 2020-02-02: qty 6.7

## 2020-02-02 MED ORDER — OSELTAMIVIR PHOSPHATE 75 MG PO CAPS: 75.0000 mg | ORAL_CAPSULE | Freq: Two times a day (BID) | ORAL | 0 refills | Status: AC

## 2020-02-02 NOTE — ED Triage Notes (Signed)
Cough x 3 weeks. Had a neg CXR and covid at that time. Cough persists.

## 2020-02-02 NOTE — ED Provider Notes (Signed)
[place] EMERGENCY DEPARTMENT Provider Note   CSN: [id number] Arrival date & time: 02/02/20  1050     History Chief Complaint  Patient presents with  . Cough    Elijah Medina is a 59 y.o. male. He has a history of diabetes and hypertension hypercholesterolemia. He said he has felt bad for 3 weeks. Nonproductive cough. No fever but has felt chilled at times. Saw his PCP and had a negative Covid and negative chest x-ray. Continues to cough. Symptoms seem like they have gotten worse over the last day. No vomiting or diarrhea. No runny nose or sore throat. No leg swelling. PCP put him on a codeine cough suppressant with minimal improvement.  The history is provided by the patient.  Cough Cough characteristics:  Non-productive Sputum characteristics:  Nondescript Severity:  Moderate Onset quality:  Gradual Duration:  3 weeks Timing:  Intermittent Progression:  Worsening Chronicity:  New Smoker: no   Relieved by:  Nothing Worsened by:  Deep breathing Ineffective treatments:  Cough suppressants Associated symptoms: chills, shortness of breath and wheezing   Associated symptoms: no chest pain, no ear pain, no fever, no myalgias, no rash, no rhinorrhea, no sinus congestion and no sore throat        Past Medical History:  Diagnosis Date  . Diabetes mellitus without complication ([place])   . Hypercholesteremia   . Hypertension     Patient Active Problem List   Diagnosis Date Noted  . Pain in lower limb 06/15/2015    History reviewed. No pertinent surgical history.     No family history on file.  Social History   Tobacco Use  . Smoking status: Never Smoker  . Smokeless tobacco: Never Used  Substance Use Topics  . Alcohol use: No    Alcohol/week: 0.0 standard drinks  . Drug use: No    Home Medications Prior to Admission medications   Medication Sig Start Date End Date Taking? Authorizing Provider  amLODipine (NORVASC) 10 MG tablet Take 10 mg by  mouth daily.    [provider]  aspirin EC 81 MG tablet  06/18/12   [provider]  atorvastatin (LIPITOR) 20 MG tablet  05/05/15   [provider]  Blood Glucose Monitoring Suppl ([place]) w/Device KIT See admin instructions. 03/10/15   [provider]  cyclobenzaprine (FLEXERIL) 10 MG tablet Take 1 tablet (10 mg total) by mouth 2 (two) times daily as needed for muscle spasms. 09/17/16   Shary Decamp, PA-C  fluticasone Asencion Islam) 50 MCG/ACT nasal spray  11/24/13   [provider]  gabapentin (NEURONTIN) 100 MG capsule  06/09/15   [provider]  hydrochlorothiazide (HYDRODIURIL) 25 MG tablet Take 25 mg by mouth daily.    [provider]  JANUVIA 100 MG tablet  05/01/15   [provider]  losartan (COZAAR) 50 MG tablet  05/05/15   [provider]  metFORMIN (GLUCOPHAGE-XR) 500 MG 24 hr tablet  06/07/15   [provider]  traZODone (DESYREL) 100 MG tablet  05/22/15   [provider]    Allergies    Lisinopril and Hydrocodone-homatropine  Review of Systems   Review of Systems  Constitutional: Positive for chills. Negative for fever.  HENT: Negative for ear pain, rhinorrhea and sore throat.   Eyes: Negative for visual disturbance.  Respiratory: Positive for cough, shortness of breath and wheezing.   Cardiovascular: Negative for chest pain.  Gastrointestinal: Negative for abdominal pain.  Genitourinary: Negative for dysuria.  Musculoskeletal: Negative for myalgias.  Skin: Negative for rash.  Neurological: Negative for syncope.    Physical Exam Updated Vital Signs BP 131/83 (BP Location: Left Arm)   Pulse 73   Temp 98.4 F (36.9 C) (Oral)   Resp 20   Ht 6' 2"  (1.88 m)   Wt (!) 138.8 kg   SpO2 100%   BMI 39.29 kg/m   Physical Exam Vitals and nursing note reviewed.  Constitutional:      General: He is not in acute distress.    Appearance: Normal appearance. He is  well-developed and well-nourished.  HENT:     Head: Normocephalic and atraumatic.  Eyes:     Conjunctiva/sclera: Conjunctivae normal.  Cardiovascular:     Rate and Rhythm: Normal rate and regular rhythm.     Heart sounds: No murmur heard.   Pulmonary:     Effort: Pulmonary effort is normal. No respiratory distress.     Breath sounds: Normal breath sounds.  Abdominal:     Palpations: Abdomen is soft.     Tenderness: There is no abdominal tenderness.  Musculoskeletal:        General: No signs of injury or edema. Normal range of motion.     Cervical back: Neck supple.     Right lower leg: No edema.     Left lower leg: No edema.  Skin:    General: Skin is warm and dry.  Neurological:     General: No focal deficit present.     Mental Status: He is alert.  Psychiatric:        Mood and Affect: Mood and affect normal.     ED Results / Procedures / Treatments   Labs (all labs ordered are listed, but only abnormal results are displayed) Labs Reviewed  RESP PANEL BY RT-PCR (FLU A&B, COVID) ARPGX2 - Abnormal; Notable for the following components:      Result Value   Influenza A by PCR POSITIVE (*)    All other components within normal limits    EKG None  Radiology DG Chest Portable 1 View  Result Date: 02/02/2020 CLINICAL DATA:  Cough for 3 weeks EXAM: PORTABLE CHEST 1 VIEW COMPARISON:  03/23/2018 FINDINGS: The heart size and mediastinal contours are within normal limits. Both lungs are clear. The visualized skeletal structures are unremarkable. IMPRESSION: No acute abnormality of the lungs in AP portable projection. No focal airspace opacity. Electronically Signed   By: Eddie Candle M.D.   On: 02/02/2020 12:02    Procedures Procedures (including critical care time)  Medications Ordered in ED Medications  albuterol (VENTOLIN HFA) 108 (90 Base) MCG/ACT inhaler 2 puff (2 puffs Inhalation Given 02/02/20 1221)    ED Course  I have reviewed the triage vital signs and the  nursing notes.  Pertinent labs & imaging results that were available during my care of the patient were reviewed by me and considered in my medical decision making (see chart for details).    MDM Rules/Calculators/A&P                         Amario Longmore Bartolini was evaluated in Emergency Department on 02/02/2020 for the symptoms described in the history of present illness. He was evaluated in the context of the global COVID-19 pandemic, which necessitated consideration that the patient might be at risk for infection with the SARS-CoV-2 virus that causes COVID-19. Institutional protocols and algorithms that pertain to the evaluation of patients at risk for  COVID-19 are in a state of rapid change based on information released by regulatory bodies including the CDC and federal and state organizations. These policies and algorithms were followed during the patient's care in the ED.  Parenteral diagnosis includes Covid, flu, pneumonia, CHF.  PE less likely not tachycardic or tachypneic not hypoxic.  Chest x-ray not showing a definitive infiltrate.  Covid testing negative.  Flu testing positive.  Hard to think that he would be symptomatic from flu for 3 weeks.  Possibly had some other viral illness going on and caught flu.  I reviewed this with him and said there is some potential benefit for treatment with Tamiflu.  We will also try an inhaler with him. Final Clinical Impression(s) / ED Diagnoses Final diagnoses:  Cough  Influenza A    Rx / DC Orders ED Discharge Orders         Ordered    oseltamivir (TAMIFLU) 75 MG capsule  Every 12 hours        02/02/20 1218           Hayden Rasmussen, MD 02/03/20 [phone]

## 2020-02-02 NOTE — Discharge Instructions (Addendum)
You are seen in the emerge department for 3 weeks of cough.Your oxygen level was normal.  Your chest x-ray did not show an obvious pneumonia.  Your Covid testing was negative but your flu test was positive.  We are treating you with Tamiflu medication which may help shorten your symptoms.  You may also use an albuterol inhaler 2 puffs every 4 hours as needed.  Follow-up with your doctor.  Return the emergency department for any worsening or concerning symptoms.

## 2021-04-24 ENCOUNTER — Other Ambulatory Visit: Payer: Self-pay

## 2021-04-24 ENCOUNTER — Emergency Department (HOSPITAL_BASED_OUTPATIENT_CLINIC_OR_DEPARTMENT_OTHER)
Admission: EM | Admit: 2021-04-24 | Discharge: 2021-04-24 | Disposition: A | Discharge status: Home / Self Care | Payer: BC Managed Care – PPO | Attending: Emergency Medicine | Admitting: Emergency Medicine

## 2021-04-24 ENCOUNTER — Encounter (HOSPITAL_BASED_OUTPATIENT_CLINIC_OR_DEPARTMENT_OTHER): Payer: Self-pay | Admitting: Emergency Medicine

## 2021-04-24 DIAGNOSIS — R059 Cough, unspecified: Secondary | ICD-10-CM | POA: Diagnosis present

## 2021-04-24 DIAGNOSIS — Z20822 Contact with and (suspected) exposure to covid-19: Secondary | ICD-10-CM | POA: Diagnosis not present

## 2021-04-24 DIAGNOSIS — J069 Acute upper respiratory infection, unspecified: Secondary | ICD-10-CM | POA: Insufficient documentation

## 2021-04-24 DIAGNOSIS — Z7984 Long term (current) use of oral hypoglycemic drugs: Secondary | ICD-10-CM | POA: Diagnosis not present

## 2021-04-24 DIAGNOSIS — Z7982 Long term (current) use of aspirin: Secondary | ICD-10-CM | POA: Diagnosis not present

## 2021-04-24 DIAGNOSIS — Z79899 Other long term (current) drug therapy: Secondary | ICD-10-CM | POA: Diagnosis not present

## 2021-04-24 LAB — RESP PANEL BY RT-PCR (FLU A&B, COVID) ARPGX2
Influenza A by PCR: NEGATIVE
Influenza B by PCR: NEGATIVE
SARS Coronavirus 2 by RT PCR: NEGATIVE

## 2021-04-24 MED ORDER — BENZONATATE 100 MG PO CAPS: 100.0000 mg | ORAL_CAPSULE | Freq: Three times a day (TID) | ORAL | 0 refills | Status: AC

## 2021-04-24 MED ORDER — ONDANSETRON 4 MG PO TBDP: ORAL_TABLET | ORAL | 0 refills | Status: AC

## 2021-04-24 NOTE — ED Triage Notes (Signed)
Pt arrives pov with c/o congestion,with cough and fever x 3 days. Requests covid test ?

## 2021-04-24 NOTE — ED Provider Notes (Signed)
?[facility] DEPARTMENT ?Provider Note ? ? ?CSN: [id number] ?Arrival date & time: 04/24/21  1705 ? ?  ? ?History ? ?Chief Complaint  ?Patient presents with  ? Cough  ? ? ?Elijah Medina is a 61 y.o. male. ? ?61 yo M with a chief complaints of cough congestion fevers chills myalgias going on for a couple days now.  He was concerned that he might have COVID went to a restaurant and the became sick a couple days later.  He denies nausea or vomiting denies abdominal discomfort denies difficulty breathing. ? ? ?Cough ? ?  ? ?Home Medications ?Prior to Admission medications   ?Medication Sig Start Date End Date Taking? Authorizing Provider  ?benzonatate (TESSALON) 100 MG capsule Take 1 capsule (100 mg total) by mouth every 8 (eight) hours. 04/24/21  Yes Deno Etienne, DO  ?ondansetron (ZOFRAN-ODT) 4 MG disintegrating tablet 70m ODT q4 hours prn nausea/vomit 04/24/21  Yes FDeno Etienne DO  ?amLODipine (NORVASC) 10 MG tablet Take 10 mg by mouth daily.    [provider]  ?aspirin EC 81 MG tablet  06/18/12   [provider]  ?atorvastatin (LIPITOR) 20 MG tablet  05/05/15   [provider]  ?Blood Glucose Monitoring Suppl ([place] w/Device KIT See admin instructions. 03/10/15   [provider]  ?cyclobenzaprine (FLEXERIL) 10 MG tablet Take 1 tablet (10 mg total) by mouth 2 (two) times daily as needed for muscle spasms. 09/17/16   MShary Decamp PA-C  ?fluticasone (Asencion Islam 50 MCG/ACT nasal spray  11/24/13   [provider]  ?gabapentin (NEURONTIN) 100 MG capsule  06/09/15   [provider]  ?hydrochlorothiazide (HYDRODIURIL) 25 MG tablet Take 25 mg by mouth daily.    [provider]  ?JANUVIA 100 MG tablet  05/01/15   [provider]  ?losartan (COZAAR) 50 MG tablet  05/05/15   [provider]  ?metFORMIN (GLUCOPHAGE-XR) 500 MG 24 hr tablet  06/07/15   [provider]  ?oseltamivir (TAMIFLU) 75 MG capsule Take 1 capsule  (75 mg total) by mouth every 12 (twelve) hours. 02/02/20   BHayden Rasmussen MD  ?traZODone (DESYREL) 100 MG tablet  05/22/15   [provider]  ?   ? ?Allergies    ?Lisinopril and Hydrocodone bit-homatrop mbr   ? ?Review of Systems   ?Review of Systems  ?Respiratory:  Positive for cough.   ? ?Physical Exam ?Updated Vital Signs ?BP 120/82   Pulse 74   Temp 98.6 ?F (37 ?C) (Oral)   Resp 16   Ht _0  (1.88 m)   Wt 127 kg   SpO2 100%   BMI 35.95 kg/m?  ?Physical Exam ?Vitals and nursing note reviewed.  ?Constitutional:   ?   Appearance: He is well-developed.  ?HENT:  ?   Head: Normocephalic and atraumatic.  ?   Comments: Swollen turbinates, posterior nasal drip, no noted sinus ttp,  ?Eyes:  ?   Pupils: Pupils are equal, round, and reactive to light.  ?Neck:  ?   Vascular: No JVD.  ?Cardiovascular:  ?   Rate and Rhythm: Normal rate and regular rhythm.  ?   Heart sounds: No murmur heard. ?  No friction rub. No gallop.  ?Pulmonary:  ?   Effort: No respiratory distress.  ?   Breath sounds: No wheezing.  ?Abdominal:  ?   General: There is no distension.  ?   Tenderness: There is no abdominal tenderness. There is no guarding  or rebound.  ?Musculoskeletal:     ?   General: Normal range of motion.  ?   Cervical back: Normal range of motion and neck supple.  ?Skin: ?   Coloration: Skin is not pale.  ?   Findings: No rash.  ?Neurological:  ?   Mental Status: He is alert and oriented to person, place, and time.  ?Psychiatric:     ?   Behavior: Behavior normal.  ? ? ?ED Results / Procedures / Treatments   ?Labs ?(all labs ordered are listed, but only abnormal results are displayed) ?Labs Reviewed  ?RESP PANEL BY RT-PCR (FLU A&B, COVID) [id number]  ? ? ?EKG ?None ? ?Radiology ?No results found. ? ?Procedures ?Procedures  ? ? ?Medications Ordered in ED ?Medications - No data to display ? ?ED Course/ Medical Decision Making/ A&P ?  ?                        ?Medical Decision Making ?Risk ?Prescription drug  management. ? ? ?61 yo M with a viral syndrome.  Going on for a few days now.  He is well-appearing and nontoxic is clear lung sounds for me.  I do not feel that he would benefit from a laboratory or imaging evaluation.  We will treat symptomatically.  Have him follow-up with his family doctor.  Prescribe cough and nausea medicine if needed. ? ?7:20 PM:  I have discussed the diagnosis/risks/treatment options with the patient.  Evaluation and diagnostic testing in the emergency department does not suggest an emergent condition requiring admission or immediate intervention beyond what has been performed at this time.  They will follow up with  PCP. We also discussed returning to the ED immediately if new or worsening sx occur. We discussed the sx which are most concerning (e.g., sudden worsening pain, fever, inability to tolerate by mouth) that necessitate immediate return. Medications administered to the patient during their visit and any new prescriptions provided to the patient are listed below. ? ?Medications given during this visit ?Medications - No data to display ? ? ?The patient appears reasonably screen and/or stabilized for discharge and I doubt any other medical condition or other [facility] requiring further screening, evaluation, or treatment in the ED at this time prior to discharge.  ? ? ? ? ? ? ? ? ?Final Clinical Impression(s) / ED Diagnoses ?Final diagnoses:  ?Viral URI with cough  ? ? ?Rx / DC Orders ?ED Discharge Orders   ? ?      Ordered  ?  ondansetron (ZOFRAN-ODT) 4 MG disintegrating tablet       ? 04/24/21 1846  ?  benzonatate (TESSALON) 100 MG capsule  Every 8 hours       ? 04/24/21 1846  ? ?  ?  ? ?  ? ? ?  ?Deno Etienne, DO ?04/24/21 1921 ? ?

## 2021-04-24 NOTE — Discharge Instructions (Signed)
You most likely have a viral illness.  This should get better on its own.  Typically supportive care works best.  I prescribed you some cough and nausea medicine in case she needed it.  Please return for difficulty breathing inability to eat or drink. ? ?Take tylenol 2 pills 4 times a day and motrin 4 pills 3 times a day.  Drink plenty of fluids.  Return for worsening shortness of breath, headache, confusion. Follow up with your family doctor.  ? ?

## 2022-11-30 IMAGING — DX DG CHEST 1V PORT
1 series · 1 of 1 positions shown · non-contrast
Comparison: 03/23/2018

CLINICAL DATA: Cough for 3 weeks

EXAM:
PORTABLE CHEST 1 VIEW

[chest ap]
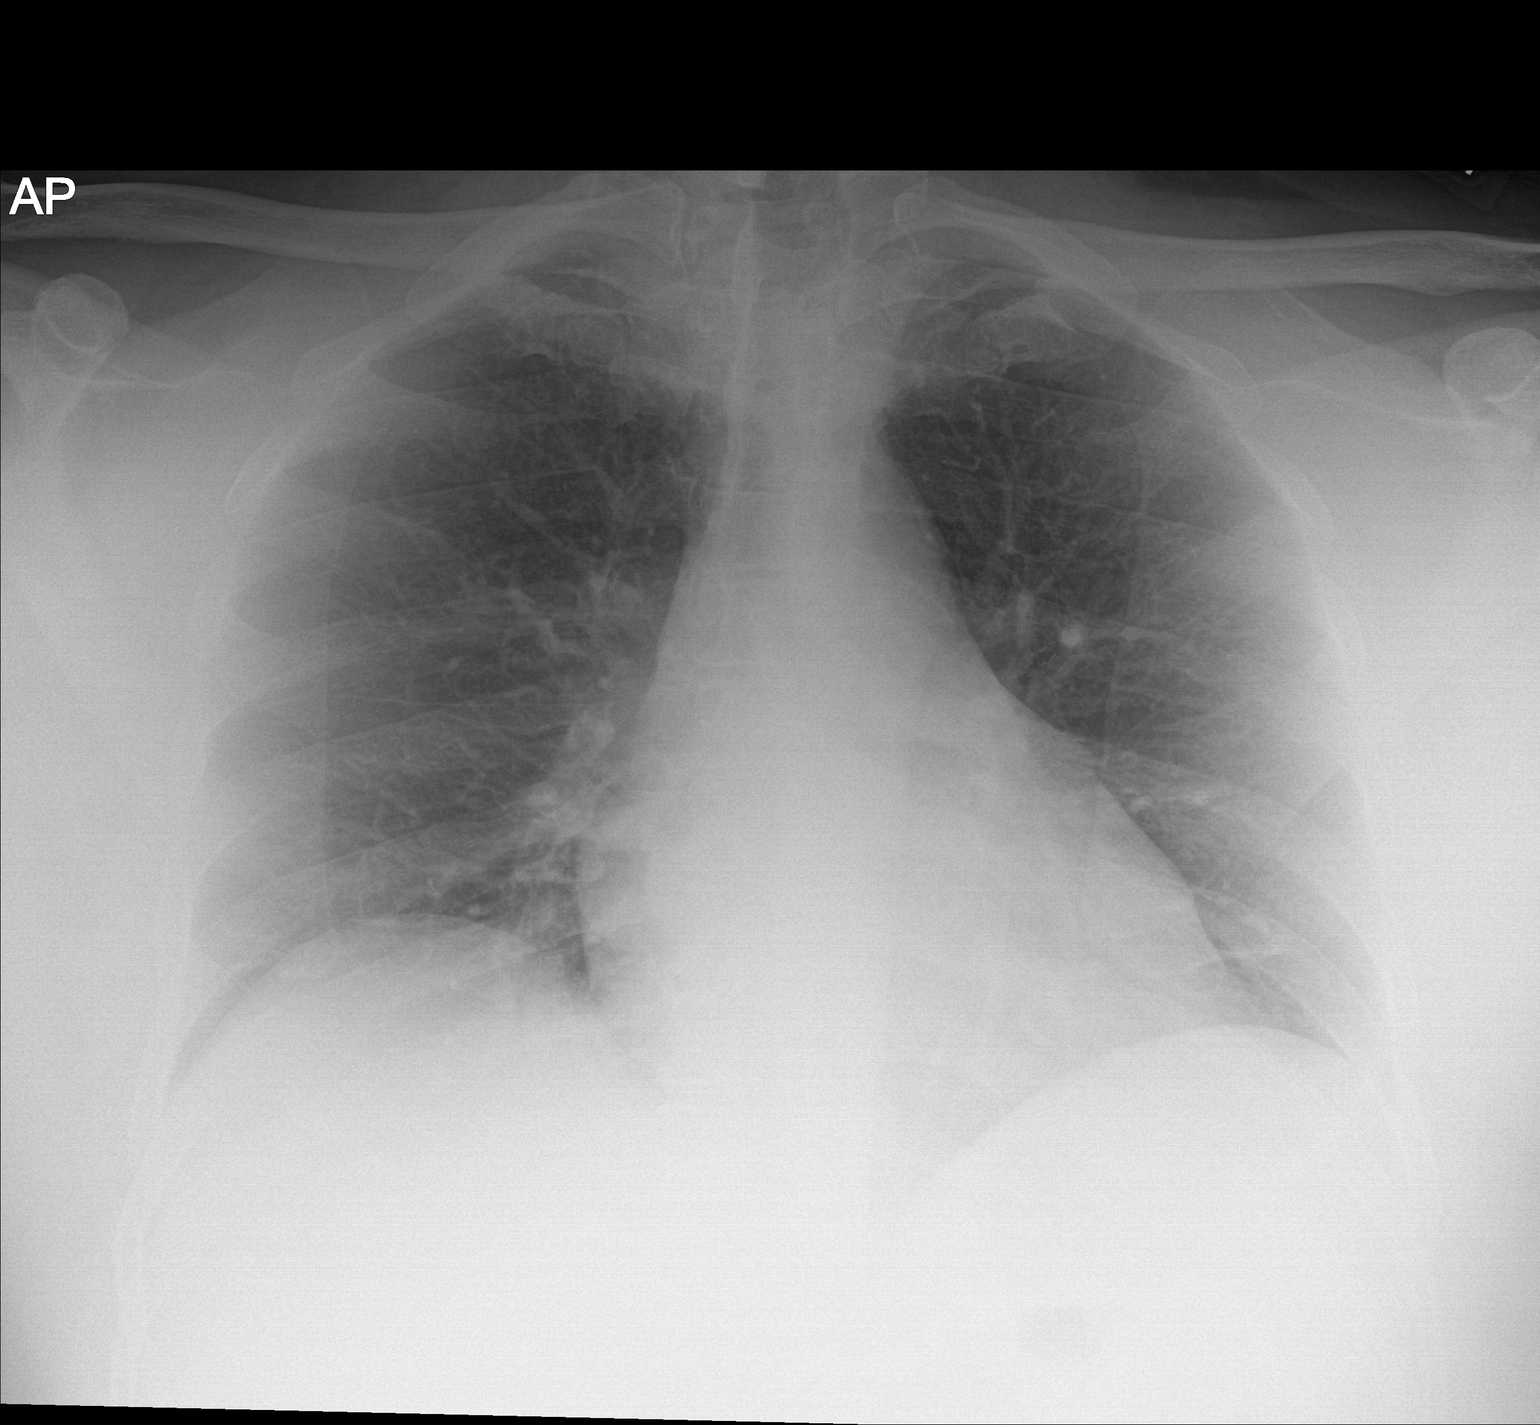

[1 of 1 positions shown; findings below may reference images not displayed]

FINDINGS: The heart size and mediastinal contours are within normal limits.
Both lungs are clear. The visualized skeletal structures are
unremarkable.
IMPRESSION: No acute abnormality of the lungs in AP portable projection. No
focal airspace opacity.

## 2024-02-26 ENCOUNTER — Ambulatory Visit (INDEPENDENT_AMBULATORY_CARE_PROVIDER_SITE_OTHER)

## 2024-02-26 ENCOUNTER — Ambulatory Visit (HOSPITAL_BASED_OUTPATIENT_CLINIC_OR_DEPARTMENT_OTHER): Payer: Self-pay | Admitting: Student

## 2024-02-26 DIAGNOSIS — M25561 Pain in right knee: Secondary | ICD-10-CM

## 2024-02-26 DIAGNOSIS — M1711 Unilateral primary osteoarthritis, right knee: Secondary | ICD-10-CM | POA: Diagnosis not present

## 2024-02-26 NOTE — Progress Notes (Signed)
 "                                Chief Complaint: Right knee pain    Discussed the use of AI scribe software for clinical note transcription with the patient, who gave verbal consent to proceed.  History of Present Illness Elijah Medina is a 64 year old male who presents with one month of right knee pain and swelling. For the past month, he has had atraumatic right knee pain and fluctuating swelling, mainly medial with some lateral pain. He notes intermittent popping and a knot-like sensation, with marked stiffness and pain after sitting 30-45 minutes. He has no prior right knee injuries, surgeries, or imaging. Ice and elevation give partial relief. Symptoms are overall stable but worse on some days, causing an antalgic gait. Pain is aggravated by prolonged walking on concrete at work. He denies other joint involvement.   Surgical History:   None  PMH/PSH/Family History/Social History/Meds/Allergies:    Past Medical History:  Diagnosis Date   Diabetes mellitus without complication (HCC)    Hypercholesteremia    Hypertension    No past surgical history on file. Social History   Socioeconomic History   Marital status: Married    Spouse name: Not on file   Number of children: Not on file   Years of education: Not on file   Highest education level: Not on file  Occupational History   Not on file  Tobacco Use   Smoking status: Never   Smokeless tobacco: Never  Substance and Sexual Activity   Alcohol use: No    Alcohol/week: 0.0 standard drinks of alcohol   Drug use: No   Sexual activity: Not on file  Other Topics Concern   Not on file  Social History Narrative   Not on file   Social Drivers of Health   Tobacco Use: Low Risk (02/05/2024)   Received from [facility]   Patient History    Smoking Tobacco Use: Never    Smokeless Tobacco Use: Never    Passive Exposure: Not on file  Financial Resource Strain: Not on file  Food Insecurity: Low Risk (02/05/2024)    Received from [facility]   Epic    Within the past 12 months, you worried that your food would run out before you got money to buy more: Never true    Within the past 12 months, the food you bought just didn't last and you didn't have money to get more. : Never true  Transportation Needs: No Transportation Needs (02/05/2024)   Received from [facility]    In the past 12 months, has lack of reliable transportation kept you from medical appointments, meetings, work or from getting things needed for daily living? : No  Physical Activity: Not on file  Stress: Not on file  Social Connections: Not on file  Depression (EYV7-0): Not on file  Alcohol Screen: Not on file  Housing: Low Risk (02/05/2024)   Received from [facility]   Epic    What is your living situation today?: I have a steady place to live    Think about the place you live. Do you have problems with any of the following? Choose all that apply:: None/None on this list  Utilities: Low Risk (02/05/2024)   Received from [facility]   Utilities    In the past 12 months has the electric, gas, oil,  or water company threatened to shut off services in your home? : No  Health Literacy: Not on file   No family history on file. Allergies[1] Current Outpatient Medications  Medication Sig Dispense Refill   amLODipine (NORVASC) 10 MG tablet Take 10 mg by mouth daily.     aspirin EC 81 MG tablet      atorvastatin (LIPITOR) 20 MG tablet      benzonatate  (TESSALON ) 100 MG capsule Take 1 capsule (100 mg total) by mouth every 8 (eight) hours. 21 capsule 0   Blood Glucose Monitoring Suppl (ONETOUCH VERIO FLEX SYSTEM) w/Device KIT See admin instructions.  0   cyclobenzaprine  (FLEXERIL ) 10 MG tablet Take 1 tablet (10 mg total) by mouth 2 (two) times daily as needed for muscle spasms. 20 tablet 0   fluticasone (FLONASE) 50 MCG/ACT nasal spray      gabapentin (NEURONTIN) 100 MG capsule      hydrochlorothiazide  (HYDRODIURIL) 25 MG tablet Take 25 mg by mouth daily.     JANUVIA 100 MG tablet      losartan (COZAAR) 50 MG tablet      metFORMIN (GLUCOPHAGE-XR) 500 MG 24 hr tablet      ondansetron  (ZOFRAN -ODT) 4 MG disintegrating tablet 4mg  ODT q4 hours prn nausea/vomit 20 tablet 0   oseltamivir  (TAMIFLU ) 75 MG capsule Take 1 capsule (75 mg total) by mouth every 12 (twelve) hours. 10 capsule 0   traZODone (DESYREL) 100 MG tablet      No current facility-administered medications for this visit.   No results found.  Review of Systems:   A ROS was performed including pertinent positives and negatives as documented in the HPI.  Physical Exam :   Constitutional: NAD and appears stated age Neurological: Alert and oriented Psych: Appropriate affect and cooperative There were no vitals taken for this visit.   Comprehensive Musculoskeletal Exam:    Right knee demonstrates bilateral joint line tenderness.  There is a mild effusion present without erythema or warmth.  Active range of motion is from 0 to 110 degrees with mild crepitus.  No laxity with varus or valgus stress.  Imaging:   Xray (right knee 4 views): Tricompartmental osteoarthritis with small tibiofemoral compartment osteophytes and mild joint space narrowing.  Mild to moderate degenerative changes within the patellofemoral compartment.   I personally reviewed and interpreted the radiographs.      Assessment & Plan Primary osteoarthritis of right knee Mild to moderate osteoarthritis with joint effusion confirmed by x-ray impairs function. Imaging is consistent with osteoarthritis. Glycemic control with last A1c of 6.4 is adequate for corticosteroid injection. Discussed intra-articular corticosteroid injection for symptom relief and joint fluid aspiration if significant effusion is present. Discussed conservative management including activity modification, ice, elevation, topical anti-inflammatories, and physical therapy.  Ultrasound  evaluation did show presence of an effusion.  Knee aspiration of 15 cc clear synovial fluid was performed from the superolateral approach followed by cortisone injection and he tolerated this well.  Can have him follow-up as needed.     Procedure Note  Patient: Elijah Medina             Date of Birth: March 01, 1960           MRN: [id number]             Visit Date: 02/26/2024  Procedures: Visit Diagnoses:  1. Unilateral primary osteoarthritis, right knee     Large Joint Inj: R knee on 02/26/2024 5:38 PM Indications: pain Details: 18 G 1.5  in needle, superolateral approach Medications: 4 mL lidocaine  1 % (2 mL betamethasone) Aspirate: 15 mL clear Outcome: tolerated well, no immediate complications Procedure, treatment alternatives, risks and benefits explained, specific risks discussed. Consent was given by the patient. Immediately prior to procedure a time out was called to verify the correct patient, procedure, equipment, support staff and site/side marked as required. Patient was prepped and draped in the usual sterile fashion.       I personally saw and evaluated the patient, and participated in the management and treatment plan.  Leonce Reveal, PA-C Orthopedics      [1]  Allergies Allergen Reactions   Lisinopril Other (See Comments)    Cough   Hydrocodone Bit-Homatrop Mbr Itching   "

## 2024-02-27 MED ORDER — LIDOCAINE HCL 1 % IJ SOLN
4.0000 mL | INTRAMUSCULAR | Status: AC | PRN
  Administered 2024-02-26: 4 mL
